# Patient Record
Sex: Female | Born: 1970 | Race: White | Hispanic: No | Smoking: Current some day smoker
Health system: Southern US, Community
[De-identification: ages and names within clinical notes are randomized; demographics above are authoritative.]

## PROBLEM LIST (undated history)

## (undated) DIAGNOSIS — I10 Essential (primary) hypertension: Secondary | ICD-10-CM

## (undated) DIAGNOSIS — E119 Type 2 diabetes mellitus without complications: Secondary | ICD-10-CM

---

## 1999-12-11 ENCOUNTER — Ambulatory Visit (HOSPITAL_COMMUNITY): Admission: RE | Admit: 1999-12-11 | Discharge: 1999-12-11 | Payer: Self-pay | Admitting: Obstetrics and Gynecology

## 1999-12-11 ENCOUNTER — Encounter: Payer: Self-pay | Admitting: Obstetrics and Gynecology

## 2000-01-11 ENCOUNTER — Inpatient Hospital Stay (HOSPITAL_COMMUNITY): Admission: AD | Admit: 2000-01-11 | Discharge: 2000-01-16 | Payer: Self-pay | Admitting: Obstetrics and Gynecology

## 2000-02-15 ENCOUNTER — Other Ambulatory Visit: Admission: RE | Admit: 2000-02-15 | Discharge: 2000-02-15 | Payer: Self-pay | Admitting: Obstetrics & Gynecology

## 2001-08-30 ENCOUNTER — Other Ambulatory Visit: Admission: RE | Admit: 2001-08-30 | Discharge: 2001-08-30 | Payer: Self-pay | Admitting: Obstetrics & Gynecology

## 2004-04-06 ENCOUNTER — Ambulatory Visit: Payer: Self-pay | Admitting: Internal Medicine

## 2004-08-15 ENCOUNTER — Emergency Department (HOSPITAL_COMMUNITY): Admission: EM | Admit: 2004-08-15 | Discharge: 2004-08-15 | Payer: Self-pay | Admitting: Family Medicine

## 2005-05-11 ENCOUNTER — Ambulatory Visit: Payer: Self-pay | Admitting: Internal Medicine

## 2005-10-14 ENCOUNTER — Ambulatory Visit: Payer: Self-pay | Admitting: Internal Medicine

## 2005-10-19 ENCOUNTER — Emergency Department (HOSPITAL_COMMUNITY): Admission: EM | Admit: 2005-10-19 | Discharge: 2005-10-20 | Payer: Self-pay | Admitting: Emergency Medicine

## 2006-02-05 IMAGING — CR DG WRIST COMPLETE 3+V*R*
4 series · 4 of 4 positions shown · non-contrast
Comparison: None.

CLINICAL DATA: Fell and injured right wrist.

RIGHT  WRIST - 4 VIEW  08/15/2004:

[view not recorded (1 of 4)]
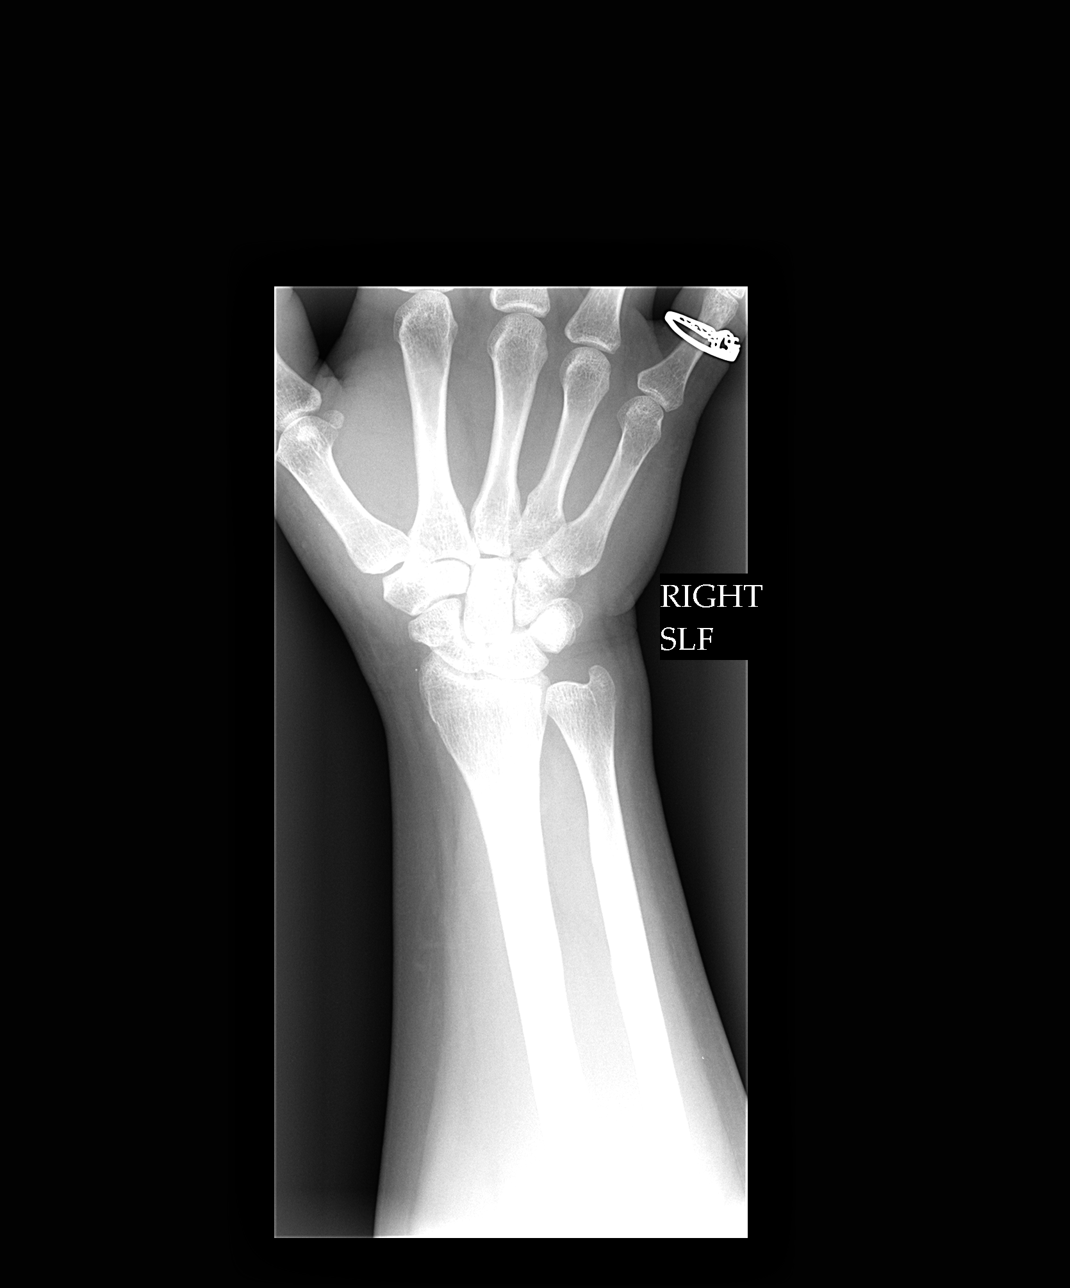

[view not recorded (2 of 4)]
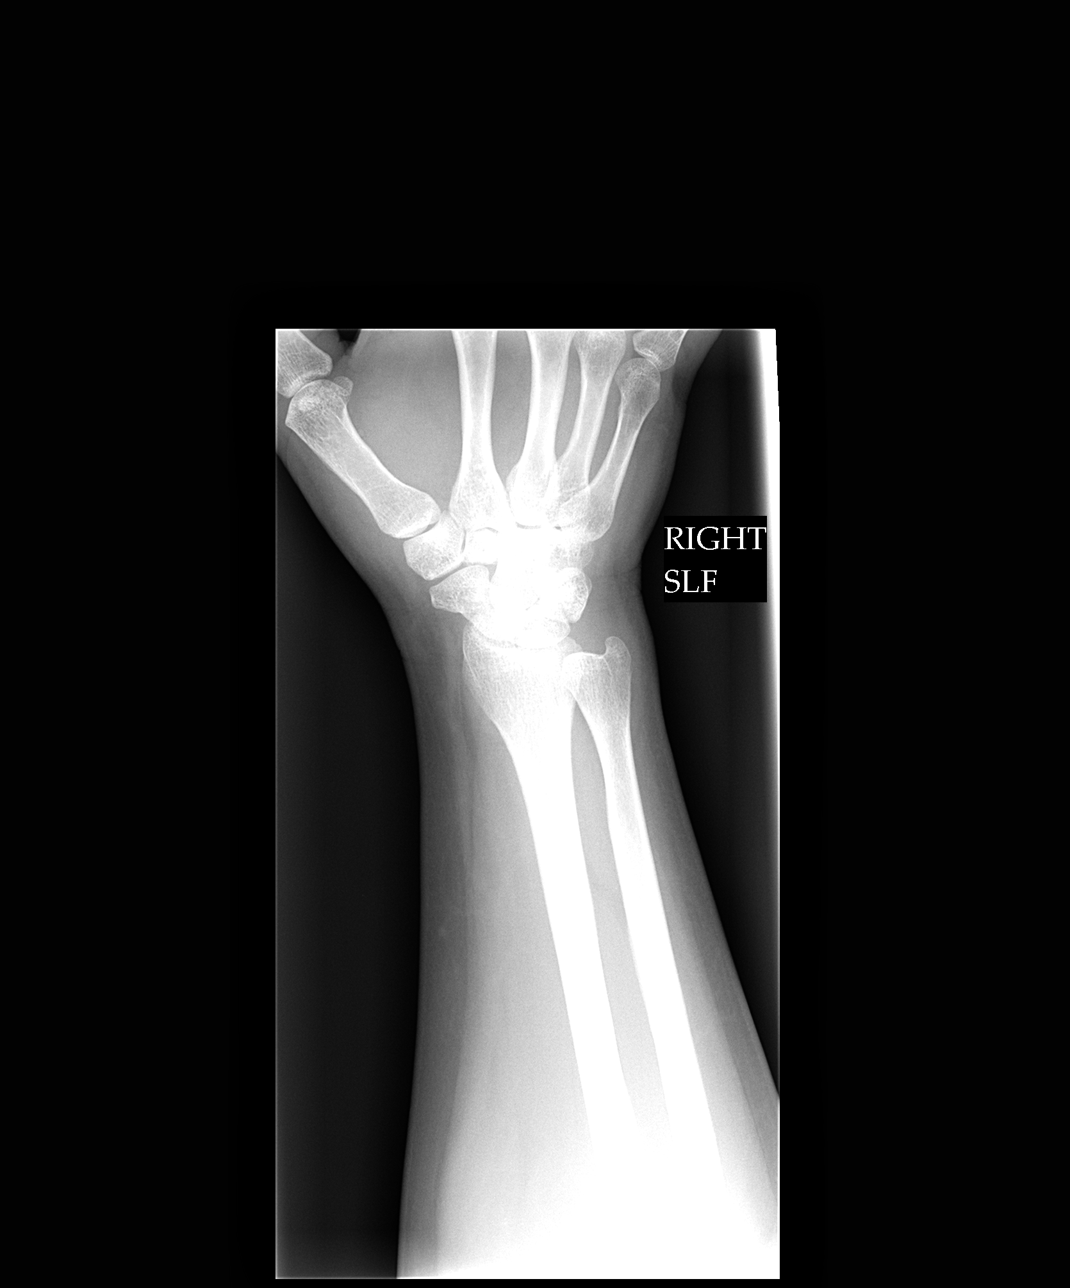

[view not recorded (3 of 4)]
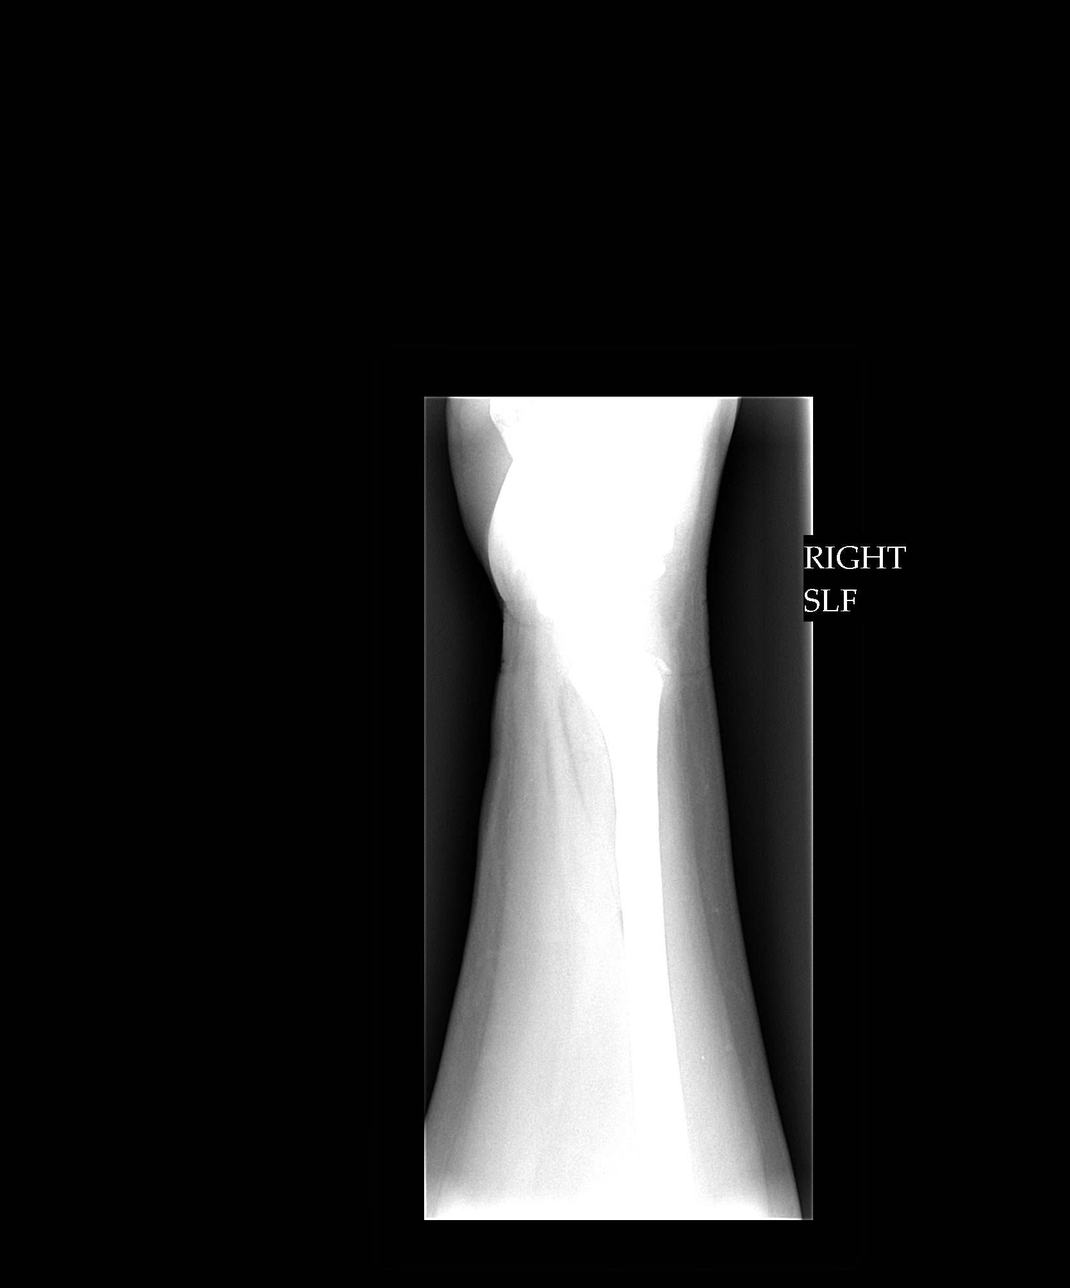

[view not recorded (4 of 4)]
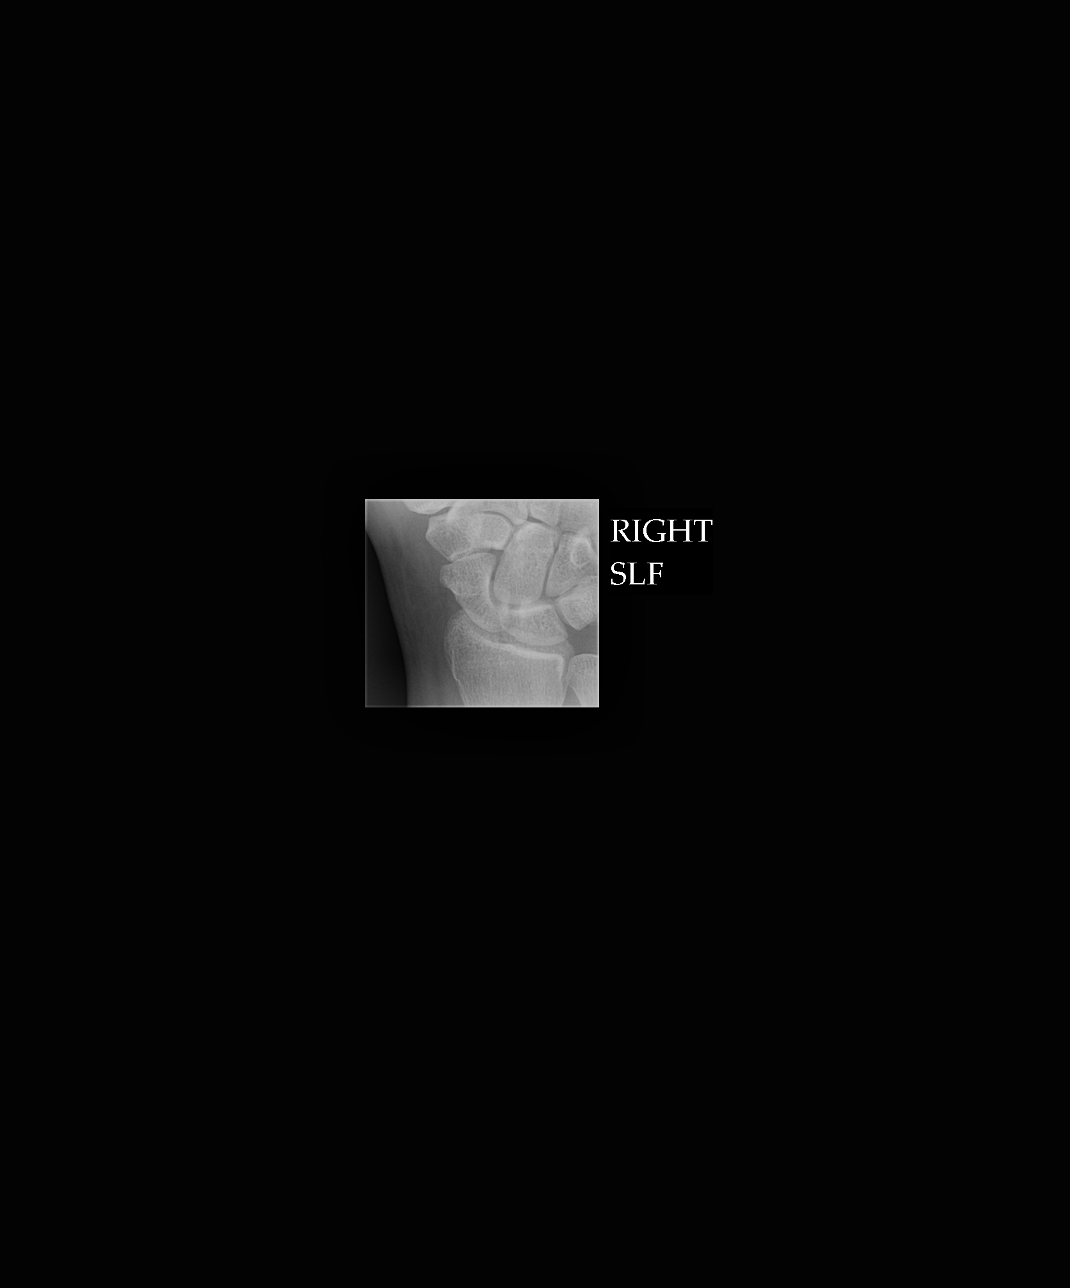

[4 of 4 positions shown; findings below may reference images not displayed]

FINDINGS: There is a minimally displaced, avulsion-type fracture involving the
dorsal aspect of the distal radius, seen only on lateral view. No other
fractures are identified. The joint spaces are well-preserved throughout the
carpus.
IMPRESSION: Avulsion fracture involving the dorsal aspect of the distal radius.

## 2011-11-18 ENCOUNTER — Other Ambulatory Visit: Payer: Self-pay | Admitting: Family Medicine

## 2011-11-19 ENCOUNTER — Other Ambulatory Visit: Payer: Self-pay | Admitting: Family Medicine

## 2012-05-08 ENCOUNTER — Other Ambulatory Visit: Payer: Self-pay | Admitting: Family Medicine

## 2012-08-02 ENCOUNTER — Other Ambulatory Visit: Payer: Self-pay | Admitting: Family Medicine

## 2012-10-23 ENCOUNTER — Encounter (HOSPITAL_COMMUNITY): Payer: Self-pay | Admitting: Emergency Medicine

## 2012-10-23 ENCOUNTER — Emergency Department (HOSPITAL_COMMUNITY)
Admission: EM | Admit: 2012-10-23 | Discharge: 2012-10-23 | Disposition: A | Discharge status: Home / Self Care | Payer: Self-pay | Attending: Emergency Medicine | Admitting: Emergency Medicine

## 2012-10-23 DIAGNOSIS — F17200 Nicotine dependence, unspecified, uncomplicated: Secondary | ICD-10-CM | POA: Insufficient documentation

## 2012-10-23 DIAGNOSIS — I10 Essential (primary) hypertension: Secondary | ICD-10-CM | POA: Insufficient documentation

## 2012-10-23 DIAGNOSIS — Z79899 Other long term (current) drug therapy: Secondary | ICD-10-CM | POA: Insufficient documentation

## 2012-10-23 DIAGNOSIS — R739 Hyperglycemia, unspecified: Secondary | ICD-10-CM

## 2012-10-23 DIAGNOSIS — R1110 Vomiting, unspecified: Secondary | ICD-10-CM

## 2012-10-23 DIAGNOSIS — R112 Nausea with vomiting, unspecified: Secondary | ICD-10-CM | POA: Insufficient documentation

## 2012-10-23 DIAGNOSIS — E1169 Type 2 diabetes mellitus with other specified complication: Secondary | ICD-10-CM | POA: Insufficient documentation

## 2012-10-23 HISTORY — DX: Essential (primary) hypertension: I10

## 2012-10-23 HISTORY — DX: Type 2 diabetes mellitus without complications: E11.9

## 2012-10-23 LAB — GLUCOSE, CAPILLARY: Glucose-Capillary: 273 mg/dL — ABNORMAL HIGH (ref 70–99)

## 2012-10-23 MED ORDER — PROMETHAZINE HCL 25 MG PO TABS: 25.0000 mg | ORAL_TABLET | Freq: Four times a day (QID) | ORAL | Status: AC | PRN

## 2012-10-23 NOTE — ED Notes (Signed)
Pt from [facility], c/o emesis x1. Pt is diabetic initial CBG 325, BP 240/120.

## 2012-10-23 NOTE — ED Provider Notes (Signed)
CSN: [id number]     Arrival date & time 10/23/12  1638 History     First MD Initiated Contact with Patient 10/23/12 1702     Chief Complaint  Patient presents with  . Emesis   (Consider location/radiation/quality/duration/timing/severity/associated sxs/prior Treatment) Patient is a 42 y.o. female presenting with vomiting. The history is provided by the patient and the EMS personnel.  Emesis Associated symptoms: no abdominal pain, no diarrhea and no headaches    patient with acute vomiting episode at a [employer] store following eating chicken filet for lunch. Patient now feels spine. Patient with new diagnosis of diabetes blood sugar at the time was 325 blood pressure was 240/120. Repeat blood pressure here his blood pressure significantly improved. 139/79. We'll recheck blood sugar here. Patient is completely asymptomatic feels that she does have 8 something that upset her stomach. No loss of consciousness no near-syncope no chest pain no shortness of breath no fevers.  Past Medical History  Diagnosis Date  . Hypertension   . Diabetes mellitus without complication    History reviewed. No pertinent past surgical history. History reviewed. No pertinent family history. History  Substance Use Topics  . Smoking status: Current Some Day Smoker  . Smokeless tobacco: Not on file  . Alcohol Use: Not on file   OB History   Grav Para Term Preterm Abortions TAB SAB Ect Mult Living                 Review of Systems  Constitutional: Negative for fever.  HENT: Negative for neck pain.   Eyes: Negative for redness and visual disturbance.  Respiratory: Negative for shortness of breath.   Cardiovascular: Negative for chest pain.  Gastrointestinal: Positive for nausea and vomiting. Negative for abdominal pain and diarrhea.  Genitourinary: Negative for dysuria.  Musculoskeletal: Negative for back pain.  Skin: Negative for rash.  Neurological: Negative for headaches.  Hematological: Does not  bruise/bleed easily.  Psychiatric/Behavioral: Negative for confusion.    Allergies  Lisinopril  Home Medications   Current Outpatient Rx  Name  Route  Sig  Dispense  Refill  . busPIRone (BUSPAR) 10 MG tablet   Oral   Take 10 mg by mouth 3 (three) times daily.         . Calcium Acetate, Phos Binder, (CALCIUM ACETATE PO)   Oral   Take 1 tablet by mouth daily.         . cloNIDine (CATAPRES) 0.3 MG tablet   Oral   Take 0.3 mg by mouth 3 (three) times daily.         . metFORMIN (GLUCOPHAGE) 500 MG tablet   Oral   Take 500 mg by mouth 2 (two) times daily with a meal.         . PARoxetine (PAXIL) 40 MG tablet   Oral   Take 40 mg by mouth at bedtime.         . pravastatin (PRAVACHOL) 20 MG tablet   Oral   Take 20 mg by mouth at bedtime.          BP 138/79  Pulse 82  Temp(Src) 98.1 F (36.7 C) (Oral)  Resp 18  SpO2 98% Physical Exam  Nursing note and vitals reviewed. Constitutional: She is oriented to person, place, and time. She appears well-developed and well-nourished. No distress.  HENT:  Head: Normocephalic and atraumatic.  Mouth/Throat: Oropharynx is clear and moist.  Eyes: Conjunctivae are normal. Pupils are equal, round, and reactive to light.  Neck: Normal  range of motion. Neck supple.  Cardiovascular: Normal rate, regular rhythm and normal heart sounds.   No murmur heard. Pulmonary/Chest: Breath sounds normal. No respiratory distress.  Abdominal: Soft. Bowel sounds are normal. There is no tenderness.  Neurological: She is alert and oriented to person, place, and time. No cranial nerve deficit. She exhibits normal muscle tone. Coordination normal.  Skin: Skin is warm. No rash noted.    ED Course   Procedures (including critical care time)  Labs Reviewed  GLUCOSE, CAPILLARY - Abnormal; Notable for the following:    Glucose-Capillary 273 (*)    All other components within normal limits   No results found. 1. Vomiting   2. Hyperglycemia      MDM  Patient's known diabetic. Recently diagnosed. Patient following check for leg that acutely nauseated and vomited at a [employer] store. EMS called and brought in. Blood sugar at the scene was in the 300s here without any treatment blood sugars down to 200s. Patient is now completely asymptomatic and feels fine no loss of consciousness no syncope or near syncopal feelings.  Shelda Jakes, MD 10/23/12 [phone]

## 2020-06-24 ENCOUNTER — Other Ambulatory Visit: Payer: Self-pay

## 2020-06-24 DIAGNOSIS — Z1231 Encounter for screening mammogram for malignant neoplasm of breast: Secondary | ICD-10-CM

## 2020-06-24 NOTE — Addendum Note (Signed)
Addended by: Meryl Dare on: 06/24/2020 02:35 PM   Modules accepted: Orders
